# Patient Record
Sex: Female | Born: 1980 | Race: White | Hispanic: No | Marital: Married | Smoking: Never smoker
Health system: Southern US, Community
[De-identification: ages and names within clinical notes are randomized; demographics above are authoritative.]

## PROBLEM LIST (undated history)

## (undated) DIAGNOSIS — I10 Essential (primary) hypertension: Secondary | ICD-10-CM

## (undated) DIAGNOSIS — F419 Anxiety disorder, unspecified: Secondary | ICD-10-CM

## (undated) HISTORY — PX: NO PAST SURGERIES: SHX2092

---

## 2009-02-19 ENCOUNTER — Observation Stay: Payer: Self-pay | Admitting: Obstetrics and Gynecology

## 2009-03-01 ENCOUNTER — Encounter: Payer: Self-pay | Admitting: Maternal and Fetal Medicine

## 2009-03-08 ENCOUNTER — Observation Stay: Payer: Self-pay | Admitting: Obstetrics and Gynecology

## 2009-04-12 ENCOUNTER — Inpatient Hospital Stay: Payer: Self-pay | Admitting: Obstetrics and Gynecology

## 2016-05-26 NOTE — H&P (Signed)
Ms. Breanna Gallegos is a 35 y.o. female here LSH and bilateral salpingectomy  . Pt has a long history of menorrhagia . Pt with a long h/o of cycles for 7-10 days + clot s. More recently bleeding has been continuous . Pt was started on high dose OCPs and bleeding initially but has recently failed to control bleeding . Marland Kitchen. Recent u/s showed only 1.6 cm simple rt ovarian cyst the patient does not desire future fertility . Pap and EMBX negative   Past Medical History:  has a past medical history of Obesity.  Past Surgical History:  has no past surgical history on file. Family History: family history includes Diabetes mellitus in her father; Heart attack in her paternal grandfather; Hypertension in her father and paternal grandfather. Social History:  reports that she has never smoked. She has never used smokeless tobacco. She reports that she does not drink alcohol or use illicit drugs. OB/GYN History:  OB History    Gravida Para Term Preterm AB TAB SAB Ectopic Multiple Living   1 1 1       1       Allergies: has No Known Allergies. Medications:  Current Outpatient Prescriptions:  .  norethindrone-ethinyl estradiol biphasic (NECON 10/11) 0.5-35/1-35 mg-mcg/mg-mcg tablet, Take 1 tablet by mouth once daily. Day 1: 1  4x a day Day 2: 1 3 x a day, Day 3: 1 2x d, Day 4: 1 qd., Disp: 28 tablet, Rfl: 0 .  ondansetron (ZOFRAN-ODT) 4 MG disintegrating tablet, Take 1 tablet (4 mg total) by mouth every 8 (eight) hours as needed for Nausea for up to 7 days., Disp: 20 tablet, Rfl: 0  Review of Systems: General:                                          No fatigue or weight loss Eyes:                                                         No vision changes Ears:                                                          No hearing difficulty Respiratory:                No cough or shortness of breath Pulmonary:                                      No asthma or shortness of breath Cardiovascular:                      No chest pain, palpitations, dyspnea on exertion Gastrointestinal:                    No abdominal bloating, chronic diarrhea, constipations, masses, pain or hematochezia Genitourinary:  No hematuria, dysuria, abnormal vaginal discharge, pelvic pain, Menometrorrhagia Lymphatic:                                       No swollen lymph nodes Musculoskeletal:                   No muscle weakness Neurologic:                                      No extremity weakness, syncope, seizure disorder Psychiatric:                                      No history of depression, delusions or suicidal/homicidal ideation    Exam:      Vitals:   05/27/16  0851  BP: 142/88  Pulse:     Body mass index is 37.45 kg/(m^2).  WDWN white/  female in NAD   Lungs: CTA  CV : RRR without murmur   Breast: exam done in sitting and lying position : No dimpling or retraction, no dominant mass, no spontaneous discharge, no axillary adenopathy Neck:  no thyromegaly Abdomen: soft , no mass, normal active bowel sounds,  non-tender, no rebound tenderness Pelvic: tanner stage 5 ,  External genitalia: vulva /labia no lesions Urethra: no prolapse Vagina: normal physiologic d/c, limited room for TVH  Cervix: no lesions, no cervical motion tenderness   Uterus: normal size shape and contour, non-tender Adnexa: no mass,  non-tender   Rectovaginal:  Impression:   . Diagnoses of Abnormal uterine bleeding (AUB)/ menorrhagia uncontrolled by conservative management . Dysmenorrhea. Pt has elected for definitive surgery .  Risks of the procedure have been discussed .     Plan:  I have spoken with the patient regarding treatment options including expectant management, hormonal options, or surgical intervention. After a full discussion the pt elects to proceed with OCps . If bleeding doesnot improve I will consider Lysteda vs ablation vs hysterectomy         Orders Placed This  Encounter  Procedures  . Pap IG, HPV-hr - [facility]

## 2016-05-27 ENCOUNTER — Encounter
Admission: RE | Admit: 2016-05-27 | Discharge: 2016-05-27 | Disposition: A | Discharge status: Home / Self Care | Payer: BC Managed Care – PPO | Source: Ambulatory Visit | Attending: Obstetrics and Gynecology | Admitting: Obstetrics and Gynecology

## 2016-05-27 DIAGNOSIS — Z01810 Encounter for preprocedural cardiovascular examination: Secondary | ICD-10-CM | POA: Insufficient documentation

## 2016-05-27 DIAGNOSIS — Z01812 Encounter for preprocedural laboratory examination: Secondary | ICD-10-CM | POA: Diagnosis not present

## 2016-05-27 DIAGNOSIS — E669 Obesity, unspecified: Secondary | ICD-10-CM | POA: Diagnosis not present

## 2016-05-27 DIAGNOSIS — I10 Essential (primary) hypertension: Secondary | ICD-10-CM | POA: Diagnosis not present

## 2016-05-27 DIAGNOSIS — Z6837 Body mass index (BMI) 37.0-37.9, adult: Secondary | ICD-10-CM | POA: Diagnosis not present

## 2016-05-27 DIAGNOSIS — N939 Abnormal uterine and vaginal bleeding, unspecified: Secondary | ICD-10-CM | POA: Insufficient documentation

## 2016-05-27 HISTORY — DX: Anxiety disorder, unspecified: F41.9

## 2016-05-27 HISTORY — DX: Essential (primary) hypertension: I10

## 2016-05-27 LAB — CBC
HEMATOCRIT: 32.5 % — AB (ref 35.0–47.0)
Hemoglobin: 9.9 g/dL — ABNORMAL LOW (ref 12.0–16.0)
MCH: 19.9 pg — ABNORMAL LOW (ref 26.0–34.0)
MCHC: 30.5 g/dL — ABNORMAL LOW (ref 32.0–36.0)
MCV: 65.2 fL — AB (ref 80.0–100.0)
Platelets: 377 10*3/uL (ref 150–440)
RBC: 4.99 MIL/uL (ref 3.80–5.20)
RDW: 17.9 % — ABNORMAL HIGH (ref 11.5–14.5)
WBC: 7.1 10*3/uL (ref 3.6–11.0)

## 2016-05-27 LAB — TYPE AND SCREEN
ABO/RH(D): B POS
Antibody Screen: NEGATIVE

## 2016-05-27 LAB — BASIC METABOLIC PANEL
Anion gap: 7 (ref 5–15)
BUN: 10 mg/dL (ref 6–20)
CHLORIDE: 105 mmol/L (ref 101–111)
CO2: 25 mmol/L (ref 22–32)
CREATININE: 0.74 mg/dL (ref 0.44–1.00)
Calcium: 9.5 mg/dL (ref 8.9–10.3)
GFR calc non Af Amer: >60 mL/min (ref >60)
Glucose, Bld: 89 mg/dL (ref 65–99)
Potassium: 3.8 mmol/L (ref 3.5–5.1)
SODIUM: 137 mmol/L (ref 135–145)

## 2016-05-27 NOTE — Pre-Procedure Instructions (Signed)
Diastolic BP in upper 90's, instructed to contact PCP for treatment prior to surgery.  Patient instructed to take BP med moring of surgery if placed a BP medication.

## 2016-05-27 NOTE — Patient Instructions (Signed)
  Your procedure is scheduled on: 06/23/16 Mon Report to Same Day Surgery 2nd floor medical mall To find out your arrival time please call [phone] between 1PM - 3PM on 06/20/16 Fri  Remember: Instructions that are not followed completely may result in serious medical risk, up to and including death, or upon the discretion of your surgeon and anesthesiologist your surgery may need to be rescheduled.    _x___ 1. Do not eat food or drink liquids after midnight. No gum chewing or hard candies.     __x__ 2. No Alcohol for 24 hours before or after surgery.   __x__3. No Smoking for 24 prior to surgery.   ____  4. Bring all medications with you on the day of surgery if instructed.    __x__ 5. Notify your doctor if there is any change in your medical condition     (cold, fever, infections).     Do not wear jewelry, make-up, hairpins, clips or nail polish.  Do not wear lotions, powders, or perfumes. You may wear deodorant.  Do not shave 48 hours prior to surgery. Men may shave face and neck.  Do not bring valuables to the hospital.    [facility] not [facility] for any belongings or valuables.               Contacts, dentures or bridgework may not be worn into surgery.  Leave your suitcase in the car. After surgery it may be brought to your room.  For patients admitted to the hospital, discharge time is determined by your treatment team.   Patients discharged the day of surgery will not be allowed to drive home.    Please read over the following fact sheets that you were given:   [facility] Preparing for Surgery and or MRSA Information   _x___ Take these medicines the morning of surgery with A SIP OF WATER:    1. None  2.  3.  4.  5.  6.  ____ Fleet Enema (as directed)   _x___ Use CHG Soap or sage wipes as directed on instruction sheet   ____ Use inhalers on the day of surgery and bring to hospital day of surgery  ____ Stop metformin 2 days prior to surgery    ____  Take 1/2 of usual insulin dose the night before surgery and none on the morning of           surgery.   ____ Stop aspirin or coumadin, or plavix  _x__ Stop Anti-inflammatories such as Advil, Aleve, Ibuprofen, Motrin, Naproxen,          Naprosyn, Goodies powders or aspirin products. Ok to take Tylenol.   ____ Stop supplements until after surgery.    ____ Bring C-Pap to the hospital.

## 2016-05-28 NOTE — Pre-Procedure Instructions (Signed)
EKG sent to Anesthesia for review. 

## 2016-06-25 ENCOUNTER — Ambulatory Visit: Payer: BC Managed Care – PPO | Admitting: Anesthesiology

## 2016-06-25 ENCOUNTER — Observation Stay
Admission: RE | Admit: 2016-06-25 | Discharge: 2016-06-26 | Disposition: A | Discharge status: Home / Self Care | Payer: BC Managed Care – PPO | Source: Ambulatory Visit | Attending: Obstetrics and Gynecology | Admitting: Obstetrics and Gynecology

## 2016-06-25 ENCOUNTER — Encounter: Payer: Self-pay | Admitting: *Deleted

## 2016-06-25 ENCOUNTER — Encounter
Admission: RE | Disposition: A | Discharge status: Home / Self Care | Payer: Self-pay | Source: Ambulatory Visit | Attending: Obstetrics and Gynecology

## 2016-06-25 DIAGNOSIS — I10 Essential (primary) hypertension: Secondary | ICD-10-CM | POA: Insufficient documentation

## 2016-06-25 DIAGNOSIS — Z8249 Family history of ischemic heart disease and other diseases of the circulatory system: Secondary | ICD-10-CM | POA: Diagnosis not present

## 2016-06-25 DIAGNOSIS — N946 Dysmenorrhea, unspecified: Secondary | ICD-10-CM | POA: Insufficient documentation

## 2016-06-25 DIAGNOSIS — Z9889 Other specified postprocedural states: Secondary | ICD-10-CM

## 2016-06-25 DIAGNOSIS — N838 Other noninflammatory disorders of ovary, fallopian tube and broad ligament: Secondary | ICD-10-CM | POA: Insufficient documentation

## 2016-06-25 DIAGNOSIS — Z793 Long term (current) use of hormonal contraceptives: Secondary | ICD-10-CM | POA: Insufficient documentation

## 2016-06-25 DIAGNOSIS — N920 Excessive and frequent menstruation with regular cycle: Principal | ICD-10-CM | POA: Insufficient documentation

## 2016-06-25 DIAGNOSIS — E669 Obesity, unspecified: Secondary | ICD-10-CM | POA: Diagnosis not present

## 2016-06-25 DIAGNOSIS — Z833 Family history of diabetes mellitus: Secondary | ICD-10-CM | POA: Insufficient documentation

## 2016-06-25 DIAGNOSIS — Z6837 Body mass index (BMI) 37.0-37.9, adult: Secondary | ICD-10-CM | POA: Insufficient documentation

## 2016-06-25 HISTORY — PX: LAPAROSCOPIC SUPRACERVICAL HYSTERECTOMY: SHX5399

## 2016-06-25 HISTORY — PX: BILATERAL SALPINGECTOMY: SHX5743

## 2016-06-25 LAB — TYPE AND SCREEN
ABO/RH(D): B POS
Antibody Screen: NEGATIVE

## 2016-06-25 LAB — POCT PREGNANCY, URINE: PREG TEST UR: NEGATIVE

## 2016-06-25 SURGERY — HYSTERECTOMY, SUPRACERVICAL, LAPAROSCOPIC
Anesthesia: General

## 2016-06-25 MED ORDER — DEXAMETHASONE SODIUM PHOSPHATE 10 MG/ML IJ SOLN
INTRAMUSCULAR | Status: DC | PRN
Start: 2016-06-25 — End: 2016-06-25
  Administered 2016-06-25: 10 mg via INTRAVENOUS

## 2016-06-25 MED ORDER — ACETAMINOPHEN 10 MG/ML IV SOLN
INTRAVENOUS | Status: DC | PRN
  Administered 2016-06-25: 1000 mg via INTRAVENOUS

## 2016-06-25 MED ORDER — FENTANYL CITRATE (PF) 100 MCG/2ML IJ SOLN
INTRAMUSCULAR | Status: DC | PRN
  Administered 2016-06-25: 100 ug via INTRAVENOUS
  Administered 2016-06-25 (×3): 50 ug via INTRAVENOUS

## 2016-06-25 MED ORDER — KETOROLAC TROMETHAMINE 30 MG/ML IJ SOLN
INTRAMUSCULAR | Status: DC | PRN
Start: 2016-06-25 — End: 2016-06-25
  Administered 2016-06-25: 30 mg via INTRAVENOUS

## 2016-06-25 MED ORDER — MIDAZOLAM HCL 2 MG/2ML IJ SOLN
INTRAMUSCULAR | Status: DC | PRN
  Administered 2016-06-25: 2 mg via INTRAVENOUS

## 2016-06-25 MED ORDER — MORPHINE SULFATE (PF) 2 MG/ML IV SOLN: 1.0000 mg | INTRAVENOUS | Status: DC | PRN

## 2016-06-25 MED ORDER — BUPIVACAINE HCL 0.5 % IJ SOLN
INTRAMUSCULAR | Status: DC | PRN
Start: 2016-06-25 — End: 2016-06-25
  Administered 2016-06-25: 3 mL

## 2016-06-25 MED ORDER — PROMETHAZINE HCL 25 MG/ML IJ SOLN: 6.2500 mg | INTRAMUSCULAR | Status: DC | PRN

## 2016-06-25 MED ORDER — LACTATED RINGERS IV SOLN
INTRAVENOUS | Status: DC
  Administered 2016-06-25 – 2016-06-26 (×2): via INTRAVENOUS

## 2016-06-25 MED ORDER — ONDANSETRON HCL 4 MG/2ML IJ SOLN
INTRAMUSCULAR | Status: DC | PRN
  Administered 2016-06-25 (×2): 4 mg via INTRAVENOUS

## 2016-06-25 MED ORDER — ROCURONIUM BROMIDE 100 MG/10ML IV SOLN
INTRAVENOUS | Status: DC | PRN
  Administered 2016-06-25: 10 mg via INTRAVENOUS
  Administered 2016-06-25: 20 mg via INTRAVENOUS
  Administered 2016-06-25: 40 mg via INTRAVENOUS
  Administered 2016-06-25: 10 mg via INTRAVENOUS

## 2016-06-25 MED ORDER — SUCCINYLCHOLINE CHLORIDE 20 MG/ML IJ SOLN
INTRAMUSCULAR | Status: DC | PRN
  Administered 2016-06-25: 100 mg via INTRAVENOUS

## 2016-06-25 MED ORDER — ONDANSETRON HCL 4 MG PO TABS: 4.0000 mg | ORAL_TABLET | Freq: Four times a day (QID) | ORAL | Status: DC | PRN

## 2016-06-25 MED ORDER — CEFOXITIN SODIUM-DEXTROSE 2-2.2 GM-% IV SOLR (PREMIX)
2.0000 g | INTRAVENOUS | Status: AC
  Administered 2016-06-25: 2000 mg via INTRAVENOUS

## 2016-06-25 MED ORDER — ACETAMINOPHEN 10 MG/ML IV SOLN
INTRAVENOUS | Status: AC
  Filled 2016-06-25: qty 100

## 2016-06-25 MED ORDER — OXYCODONE HCL 5 MG/5ML PO SOLN
5.0000 mg | Freq: Once | ORAL | Status: DC | PRN
Start: 2016-06-25 — End: 2016-06-25

## 2016-06-25 MED ORDER — IBUPROFEN 800 MG PO TABS
800.0000 mg | ORAL_TABLET | Freq: Three times a day (TID) | ORAL | Status: DC | PRN
  Administered 2016-06-25 – 2016-06-26 (×2): 800 mg via ORAL
  Filled 2016-06-25 (×2): qty 1

## 2016-06-25 MED ORDER — LIDOCAINE HCL (CARDIAC) 20 MG/ML IV SOLN
INTRAVENOUS | Status: DC | PRN
  Administered 2016-06-25: 100 mg via INTRAVENOUS

## 2016-06-25 MED ORDER — FAMOTIDINE 20 MG PO TABS
20.0000 mg | ORAL_TABLET | Freq: Once | ORAL | Status: AC
  Administered 2016-06-25: 20 mg via ORAL

## 2016-06-25 MED ORDER — MEPERIDINE HCL 25 MG/ML IJ SOLN: 6.2500 mg | INTRAMUSCULAR | Status: DC | PRN

## 2016-06-25 MED ORDER — PROPOFOL 10 MG/ML IV BOLUS
INTRAVENOUS | Status: DC | PRN
  Administered 2016-06-25: 180 mg via INTRAVENOUS

## 2016-06-25 MED ORDER — ONDANSETRON HCL 4 MG/2ML IJ SOLN: 4.0000 mg | Freq: Four times a day (QID) | INTRAMUSCULAR | Status: DC | PRN

## 2016-06-25 MED ORDER — FENTANYL CITRATE (PF) 100 MCG/2ML IJ SOLN: 25.0000 ug | INTRAMUSCULAR | Status: DC | PRN

## 2016-06-25 MED ORDER — OXYCODONE HCL 5 MG PO TABS: 5.0000 mg | ORAL_TABLET | Freq: Once | ORAL | Status: DC | PRN

## 2016-06-25 MED ORDER — OXYCODONE-ACETAMINOPHEN 5-325 MG PO TABS
1.0000 | ORAL_TABLET | ORAL | Status: DC | PRN
  Administered 2016-06-25 (×2): 1 via ORAL
  Filled 2016-06-25 (×2): qty 1

## 2016-06-25 MED ORDER — LACTATED RINGERS IV SOLN
INTRAVENOUS | Status: DC
  Administered 2016-06-25 (×3): via INTRAVENOUS

## 2016-06-25 SURGICAL SUPPLY — 39 items
BAG URO DRAIN 2000ML W/SPOUT (MISCELLANEOUS) ×4 IMPLANT
BLADE SURG SZ11 CARB STEEL (BLADE) ×4 IMPLANT
CANISTER SUCT 1200ML W/VALVE (MISCELLANEOUS) ×4 IMPLANT
CATH FOLEY 2WAY  5CC 16FR (CATHETERS) ×2
CATH URTH 16FR FL 2W BLN LF (CATHETERS) ×2 IMPLANT
CHLORAPREP W/TINT 26ML (MISCELLANEOUS) ×4 IMPLANT
CLOSURE WOUND 1/2 X4 (GAUZE/BANDAGES/DRESSINGS) ×1
DRSG TEGADERM 2-3/8X2-3/4 SM (GAUZE/BANDAGES/DRESSINGS) ×4 IMPLANT
GAUZE SPONGE NON-WVN 2X2 STRL (MISCELLANEOUS) ×2 IMPLANT
GLOVE BIO SURGEON STRL SZ8 (GLOVE) ×12 IMPLANT
GOWN STRL REUS W/ TWL LRG LVL3 (GOWN DISPOSABLE) ×4 IMPLANT
GOWN STRL REUS W/ TWL XL LVL3 (GOWN DISPOSABLE) ×2 IMPLANT
GOWN STRL REUS W/TWL LRG LVL3 (GOWN DISPOSABLE) ×4
GOWN STRL REUS W/TWL XL LVL3 (GOWN DISPOSABLE) ×2
GRASPER SUT TROCAR 14GX15 (MISCELLANEOUS) ×4 IMPLANT
IRRIGATION STRYKERFLOW (MISCELLANEOUS) ×2 IMPLANT
IRRIGATOR STRYKERFLOW (MISCELLANEOUS) ×4
IV LACTATED RINGERS 1000ML (IV SOLUTION) ×4 IMPLANT
KIT RM TURNOVER CYSTO AR (KITS) ×4 IMPLANT
LABEL OR SOLS (LABEL) ×4 IMPLANT
MORCELLATOR XCISE  COR (MISCELLANEOUS) ×2
MORCELLATOR XCISE COR (MISCELLANEOUS) ×2 IMPLANT
NS IRRIG 500ML POUR BTL (IV SOLUTION) ×4 IMPLANT
PACK GYN LAPAROSCOPIC (MISCELLANEOUS) ×4 IMPLANT
PAD OB MATERNITY 4.3X12.25 (PERSONAL CARE ITEMS) ×4 IMPLANT
PAD PREP 24X41 OB/GYN DISP (PERSONAL CARE ITEMS) ×4 IMPLANT
SET CYSTO W/LG BORE CLAMP LF (SET/KITS/TRAYS/PACK) ×4 IMPLANT
SHEARS HARMONIC ACE PLUS 36CM (ENDOMECHANICALS) ×4 IMPLANT
SOLUTION ELECTROLUBE (MISCELLANEOUS) ×4 IMPLANT
SPONGE VERSALON 2X2 STRL (MISCELLANEOUS) ×2
STRIP CLOSURE SKIN 1/2X4 (GAUZE/BANDAGES/DRESSINGS) ×3 IMPLANT
SUT VIC AB 2-0 UR6 27 (SUTURE) ×4 IMPLANT
SUT VIC AB 4-0 SH 27 (SUTURE) ×4
SUT VIC AB 4-0 SH 27XANBCTRL (SUTURE) ×4 IMPLANT
SUT VICRYL 0 AB UR-6 (SUTURE) ×4 IMPLANT
SYRINGE 10CC LL (SYRINGE) ×4 IMPLANT
TROCAR ENDO BLADELESS 11MM (ENDOMECHANICALS) ×4 IMPLANT
TROCAR XCEL UNIV SLVE 11M 100M (ENDOMECHANICALS) ×4 IMPLANT
TUBING INSUFFLATOR HI FLOW (MISCELLANEOUS) ×4 IMPLANT

## 2016-06-25 NOTE — Anesthesia Postprocedure Evaluation (Signed)
Anesthesia Post Note  Patient: [name]  Procedure(s) Performed: Procedure(s) (LRB): LAPAROSCOPIC SUPRACERVICAL HYSTERECTOMY (N/A) BILATERAL SALPINGECTOMY (Bilateral)  Patient location during evaluation: PACU Anesthesia Type: General Level of consciousness: awake and alert and oriented Pain management: pain level controlled Vital Signs Assessment: post-procedure vital signs reviewed and stable Respiratory status: spontaneous breathing, nonlabored ventilation and respiratory function stable Cardiovascular status: blood pressure returned to baseline and stable Postop Assessment: no signs of nausea or vomiting Anesthetic complications: no    Last Vitals:  Filed Vitals:   06/25/16 0902 06/25/16 1224  BP: 147/94 135/75  Pulse:  80  Temp:  37.1 C  Resp:  19    Last Pain:  Filed Vitals:   06/25/16 1233  PainSc: Asleep                 Micha Erck

## 2016-06-25 NOTE — Anesthesia Preprocedure Evaluation (Addendum)
Anesthesia Evaluation  Patient identified by MRN, date of birth, ID band Patient awake    Reviewed: Allergy & Precautions, NPO status , Patient's Chart, lab work & pertinent test results  History of Anesthesia Complications Negative for: history of anesthetic complications  Airway Mallampati: II  TM Distance: >3 FB Neck ROM: Full    Dental no notable dental hx.    Pulmonary neg COPD,    breath sounds clear to auscultation- rhonchi (-) wheezing      Cardiovascular Exercise Tolerance: Good hypertension (had gHTN, and has had elevated pressures when in clinic, but takes BP at home and is normal), (-) CAD and (-) Past MI  Rhythm:Regular Rate:Normal - Systolic murmurs and - Diastolic murmurs    Neuro/Psych PSYCHIATRIC DISORDERS Anxiety    GI/Hepatic negative GI ROS, Neg liver ROS,   Endo/Other  negative endocrine ROSneg diabetes  Renal/GU negative Renal ROS     Musculoskeletal negative musculoskeletal ROS (+)   Abdominal (+) + obese,   Peds  Hematology negative hematology ROS (+)   Anesthesia Other Findings   Reproductive/Obstetrics negative OB ROS                           Anesthesia Physical Anesthesia Plan  ASA: II  Anesthesia Plan: General   Post-op Pain Management:    Induction: Intravenous  Airway Management Planned: Oral ETT  Additional Equipment:   Intra-op Plan:   Post-operative Plan: Extubation in OR  Informed Consent: I have reviewed the patients History and Physical, chart, labs and discussed the procedure including the risks, benefits and alternatives for the proposed anesthesia with the patient or authorized representative who has indicated his/her understanding and acceptance.   Dental advisory given  Plan Discussed with: CRNA and Anesthesiologist  Anesthesia Plan Comments:         Anesthesia Quick Evaluation

## 2016-06-25 NOTE — Brief Op Note (Signed)
06/25/2016  12:06 PM  PATIENT:  Breanna Gallegos  35 y.o. female  PRE-OPERATIVE DIAGNOSIS:  Menorrhagia  POST-OPERATIVE DIAGNOSIS:  Menorrhagia, right paratubal cyst   PROCEDURE:  Procedure(s): LAPAROSCOPIC SUPRACERVICAL HYSTERECTOMY (N/A) BILATERAL SALPINGECTOMY (Bilateral)  SURGEON:  Surgeon(s) and Role:    Suzy Bouchard* Arzell Mcgeehan J Chyrel Taha, MD - Primary    Christeen DouglasBethany Beasley MD  PHYSICIAN ASSISTANT:   ASSISTANTS: none   ANESTHESIA:   general  EBL:  Total I/O In: 1000 [I.V.:1000] Out: 650 [Urine:600; Blood:50]  BLOOD ADMINISTERED:none  DRAINS: Urinary Catheter (Foley)   LOCAL MEDICATIONS USED:  MARCAINE     SPECIMEN:  Source of Specimen:  uterus , bilateral tubes   DISPOSITION OF SPECIMEN:  PATHOLOGY  COUNTS:  YES  TOURNIQUET:  * No tourniquets in log *  DICTATION: .verbal  PLAN OF CARE: Admit for overnight observation  PATIENT DISPOSITION:  PACU - hemodynamically stable.   Delay start of Pharmacological VTE agent (>24hrs) due to surgical blood loss or risk of bleeding: not applicable

## 2016-06-25 NOTE — Op Note (Signed)
Breanna Gallegos, Breanna Gallegos               ACCOUNT NO.:  [id number]  MEDICAL RECORD NO.:  [id number]  LOCATION:  [facility]                         FACILITY:  [facility]  PHYSICIAN:  Jennell Corner, MDDATE OF BIRTH:  February 13, 1981  DATE OF PROCEDURE:  06/25/2016 DATE OF DISCHARGE:                              OPERATIVE REPORT   PREOPERATIVE DIAGNOSIS:  Menorrhagia, failing conservative treatment.  POSTOPERATIVE DIAGNOSIS: 1. Menorrhagia, failing conservative treatment. 2. Right paratubal cyst.  PROCEDURE: 1. Laparoscopic supracervical hysterectomy. 2. Bilateral salpingectomy.  ANESTHESIA:  General endotracheal anesthesia.  SURGEON:  Jennell Corner, MD  FIRST ASSISTANT:  Dr. Dalbert Garnet.  INDICATIONS:  A 35 year old female, failing conservative treatment for the prolonged menorrhagia.  The patient's Pap smear and endometrial biopsy both of which were normal.  Ultrasound failed to demonstrate any significant fibroids.  The patient had been counseled regarding the potential risks for morcellation and the potential risks for spreading leiomyosarcoma through this procedure and consents have been signed.  DESCRIPTION OF PROCEDURE:  After adequate general endotracheal anesthesia, patient was placed in dorsal supine position.  Legs in the [place] stirrups.  Abdomen, perineum, and vagina were prepped, and the patient was draped in normal sterile fashion.  Time-out was performed. The patient did receive 2 g IV cefoxitin prior to commencement of the case.  Foley catheter was placed in the bladder yielding clear urine. Speculum was placed in the vagina and the anterior cervix was grasped with a single-tooth tenaculum, and the uterine sound was placed into the endometrium and the 2 were taped together with Steri-Strips.  This was to be used for uterine manipulation during the procedure.  Gloves were changed.  Attention directed to the patient's abdomen.  A 15 mm infraumbilical incision was made after  injecting with 0.5% Marcaine. The laparoscope was advanced into the abdominal cavity under direct visualization with the Optiview cannula.  The patient was placed in Trendelenburg.  Second port site was placed in left lower quadrant.  A 10 mm port was placed 3 cm medial to the left anterior iliac spine. Direct visualization was used to place the port. Third port site was placed in the right lower quadrant.  Again, a 5 mm port was advanced under direct visualization, 3 mm medial to the right anterior iliac spine.  Right fallopian tube was grasped with the fimbriated end and with the Harmonic Scalpel, the fallopian tube was dissected from the mesosalpinx.  The uterine ovarian ligament was then transected with Harmonic Scalpel, and the broad ligament was then incised and bladder flap was created anteriorly.  The left uterine artery was identified and skeletonized and first cauterized with the Kleppinger cautery and then transected with Harmonic Scalpel.  Similar procedure was repeated on the patient's right side.  At this time, a paratubal cyst approximately 2 x 1 cm was identified.  The fallopian tube was grasped with the distal portion and dissected free from the mesosalpinx.  Uterine ovarian ligament was transected and broad ligament was incised, and the bladder flap anteriorly was completed.  The right uterine artery was identified, skeletonized, and cauterized with the Kleppinger and then transected with the Harmonic Scalpel.  The cervix was then transected proximally at the level of  uterosacral ligaments.  Uterine sound was removed and the uterus was detached good.  Hemostasis was noted.  A vaginal hand allowed for cautery of the remaining endocervical stump.  Gloves were changed again and the morcellator was brought up to the operative field and was advanced into the abdominal cavity under direct visualization.  The uterus and fallopian tubes were then morcellated through this port  site. The patient's abdomen was irrigated and good hemostasis was noted. Normal peristaltic ureteral activity was seen prior to initial dissection and again visualized at the end of the case.  The upper abdomen appeared normal.  The patient's abdomen was deflated and the infraumbilical and left lower port site were closed with a 2 layer closure, and fascial layer closed with 2-0 Vicryl suture and all skin incisions were closed with interrupted 4-0 Vicryl suture.  Single-tooth tenaculum was removed from the anterior cervix.  Good hemostasis was noted.  There were no complications.  ESTIMATED BLOOD LOSS:  50 mL.  INTRAOPERATIVE FLUIDS:  1200 mL.  URINE OUTPUT:  600 mL.  Patient tolerated the procedure well and was taken to recovery room in good condition.  Of note, Foley was placed prior to commencement of the case.          ______________________________ Jennell Cornerhomas Schermerhorn, MD     TS/MEDQ  D:  06/25/2016  T:  06/25/2016  Job:  [id number]

## 2016-06-25 NOTE — Transfer of Care (Signed)
Immediate Anesthesia Transfer of Care Note  Patient: Breanna Gallegos  Procedure(s) Performed: Procedure(s): LAPAROSCOPIC SUPRACERVICAL HYSTERECTOMY (N/A) BILATERAL SALPINGECTOMY (Bilateral)  Patient Location: PACU  Anesthesia Type:General  Level of Consciousness: sedated  Airway & Oxygen Therapy: Patient Spontanous Breathing and Patient connected to face mask oxygen  Post-op Assessment: Report given to RN and Post -op Vital signs reviewed and stable  Post vital signs: Reviewed and stable  Last Vitals:  Filed Vitals:   06/25/16 0839 06/25/16 0902  BP:  147/94  Pulse: 107   Temp: 36.8 C   Resp: 20     Last Pain: There were no vitals filed for this visit.       Complications: No apparent anesthesia complications

## 2016-06-25 NOTE — Progress Notes (Signed)
Patient ID: Breanna Gallegos, female   DOB: 04-Jun-1981, 35 y.o.   MRN: [id number] Dos . Doing well . No significantpain . + hungry  O: VSS  Abd ; incisions C/D/I  No blood on pad  A: Stable  P: cont care  D/c foley

## 2016-06-25 NOTE — Progress Notes (Signed)
Labs reviewed , negative HCG , . All questions answered . Proceed with LSH + bilat salpingectomy

## 2016-06-25 NOTE — Anesthesia Procedure Notes (Signed)
Procedure Name: Intubation Date/Time: 06/25/2016 10:52 AM Performed by: Junious SilkNOLES, Page Pucciarelli Pre-anesthesia Checklist: Patient identified, Patient being monitored, Timeout performed, Emergency Drugs available and Suction available Patient Re-evaluated:Patient Re-evaluated prior to inductionOxygen Delivery Method: Circle system utilized Preoxygenation: Pre-oxygenation with 100% oxygen Intubation Type: IV induction Ventilation: Mask ventilation without difficulty Laryngoscope Size: Mac and 3 Grade View: Grade I Tube type: Oral Tube size: 7.0 mm Number of attempts: 1 Airway Equipment and Method: Stylet Placement Confirmation: ETT inserted through vocal cords under direct vision,  positive ETCO2 and breath sounds checked- equal and bilateral Secured at: 21 cm Tube secured with: Tape Dental Injury: Teeth and Oropharynx as per pre-operative assessment

## 2016-06-26 DIAGNOSIS — N838 Other noninflammatory disorders of ovary, fallopian tube and broad ligament: Secondary | ICD-10-CM | POA: Diagnosis not present

## 2016-06-26 LAB — CBC
HEMATOCRIT: 31.6 % — AB (ref 35.0–47.0)
HEMOGLOBIN: 10 g/dL — AB (ref 12.0–16.0)
MCH: 19.2 pg — ABNORMAL LOW (ref 26.0–34.0)
MCHC: 31.6 g/dL — AB (ref 32.0–36.0)
MCV: 60.8 fL — ABNORMAL LOW (ref 80.0–100.0)
Platelets: 338 10*3/uL (ref 150–440)
RBC: 5.2 MIL/uL (ref 3.80–5.20)
RDW: 18.5 % — AB (ref 11.5–14.5)
WBC: 15.3 10*3/uL — AB (ref 3.6–11.0)

## 2016-06-26 LAB — BASIC METABOLIC PANEL
ANION GAP: 6 (ref 5–15)
BUN: 11 mg/dL (ref 6–20)
CALCIUM: 8.9 mg/dL (ref 8.9–10.3)
CO2: 25 mmol/L (ref 22–32)
CREATININE: 0.75 mg/dL (ref 0.44–1.00)
Chloride: 107 mmol/L (ref 101–111)
GFR calc non Af Amer: >60 mL/min (ref >60)
Glucose, Bld: 135 mg/dL — ABNORMAL HIGH (ref 65–99)
Potassium: 4.8 mmol/L (ref 3.5–5.1)
SODIUM: 138 mmol/L (ref 135–145)

## 2016-06-26 LAB — SURGICAL PATHOLOGY

## 2016-06-26 MED ORDER — DOCUSATE SODIUM 100 MG PO CAPS: 100.0000 mg | ORAL_CAPSULE | Freq: Two times a day (BID) | ORAL | Status: AC

## 2016-06-26 MED ORDER — IBUPROFEN 800 MG PO TABS: 800.0000 mg | ORAL_TABLET | Freq: Three times a day (TID) | ORAL | Status: AC | PRN

## 2016-06-26 MED ORDER — OXYCODONE-ACETAMINOPHEN 5-325 MG PO TABS: 1.0000 | ORAL_TABLET | ORAL | Status: DC | PRN

## 2016-06-26 NOTE — Progress Notes (Signed)
Discharge instructions complete and prescriptions given. Patient verbalizes understanding of teaching. Patient discharged home at 0940.

## 2016-06-26 NOTE — Discharge Summary (Signed)
Physician Discharge Summary  Patient ID: Breanna Gallegos MRN: [id number] DOB/AGE: [birth year] 35 y.o.  Admit date: 06/25/2016 Discharge date: 06/26/2016  Admission Diagnoses:menorrrhagia  Discharge Diagnoses: same  Active Problems:   Post-operative state   Discharged Condition: good  Hospital Course: underwent an uncomplicated LSH , bilat salpingectomy  . Stable on d/c . discharge hct 31% , bun/cr 11/0.7  Consults: None  Significant Diagnostic Studies: labs: as above  Treatments: surgery: as above  Discharge Exam: Blood pressure 129/74, pulse 63, temperature 98.3 F (36.8 C), temperature source Oral, resp. rate 16, SpO2 100 %. General appearance: alert and cooperative Resp: clear to auscultation bilaterally Cardio: regular rate and rhythm, S1, S2 normal, no murmur, click, rub or gallop GI: soft, non-tender; bowel sounds normal; no masses,  no organomegaly  Disposition:      Medication List    STOP taking these medications        levonorgestrel-ethinyl estradiol 0.15-0.03 MG tablet  Commonly known as:  SEASONALE,INTROVALE,JOLESSA      TAKE these medications        docusate sodium 100 MG capsule  Commonly known as:  COLACE  Take 1 capsule (100 mg total) by mouth 2 (two) times daily.     ibuprofen 800 MG tablet  Commonly known as:  ADVIL,MOTRIN  Take 1 tablet (800 mg total) by mouth every 8 (eight) hours as needed (mild pain).     oxyCODONE-acetaminophen 5-325 MG tablet  Commonly known as:  PERCOCET/ROXICET  Take 1-2 tablets by mouth every 4 (four) hours as needed (moderate to severe pain (when tolerating fluids)).           Follow-up Information    Follow up with Jerrin Recore, MD In 20 weeks.   Specialty:  Obstetrics and Gynecology   Why:  For wound re-check   Contact information:   [address] [facility] [place] [id number] [phone]       Signed: Jennell CornerSCHERMERHORN,Onyx Edgley 06/26/2016, 8:02 AM

## 2017-07-22 ENCOUNTER — Other Ambulatory Visit: Payer: Self-pay | Admitting: Obstetrics and Gynecology

## 2017-07-22 DIAGNOSIS — Z1231 Encounter for screening mammogram for malignant neoplasm of breast: Secondary | ICD-10-CM

## 2017-08-06 ENCOUNTER — Ambulatory Visit
Admission: RE | Admit: 2017-08-06 | Discharge: 2017-08-06 | Disposition: A | Discharge status: Home / Self Care | Payer: BC Managed Care – PPO | Source: Ambulatory Visit | Attending: Obstetrics and Gynecology | Admitting: Obstetrics and Gynecology

## 2017-08-06 DIAGNOSIS — Z1231 Encounter for screening mammogram for malignant neoplasm of breast: Secondary | ICD-10-CM | POA: Diagnosis present

## 2020-02-04 ENCOUNTER — Ambulatory Visit: Payer: BC Managed Care – PPO | Attending: Internal Medicine

## 2020-02-04 DIAGNOSIS — Z23 Encounter for immunization: Secondary | ICD-10-CM | POA: Insufficient documentation

## 2020-02-04 NOTE — Progress Notes (Signed)
   Covid-19 Vaccination Clinic  Name:  Breanna Gallegos    MRN: [id number] DOB: 05/11/1981  02/04/2020  [name] was observed post Covid-19 immunization for 15 minutes without incidence. She was provided with Vaccine Information Sheet and instruction to access the V-Safe system.   [name] was instructed to call 911 with any severe reactions post vaccine: Marland Kitchen Difficulty breathing  . Swelling of your face and throat  . A fast heartbeat  . A bad rash all over your body  . Dizziness and weakness    Immunizations Administered    Name Date Dose VIS Date Route   Moderna COVID-19 Vaccine 02/04/2020  4:19 PM 0.5 mL 11/08/2019 Intramuscular   Manufacturer: Moderna   Lot: [id number]   NDC: 13086-578-46

## 2020-03-03 ENCOUNTER — Ambulatory Visit: Payer: BC Managed Care – PPO | Attending: Internal Medicine

## 2020-03-03 DIAGNOSIS — Z23 Encounter for immunization: Secondary | ICD-10-CM

## 2020-03-03 NOTE — Progress Notes (Signed)
   Covid-19 Vaccination Clinic  Name:  [name]    MRN: [id number] DOB: 29-Jul-1981  03/03/2020  [name] was observed post Covid-19 immunization for 15 minutes without incident. She was provided with Vaccine Information Sheet and instruction to access the V-Safe system.   [name] was instructed to call 911 with any severe reactions post vaccine: Marland Kitchen Difficulty breathing  . Swelling of face and throat  . A fast heartbeat  . A bad rash all over body  . Dizziness and weakness   Immunizations Administered    Name Date Dose VIS Date Route   Moderna COVID-19 Vaccine 03/03/2020  1:27 PM 0.5 mL 11/08/2019 Intramuscular   Manufacturer: Gala Murdoch   Lot: [id number]   NDC: [id number]

## 2020-03-08 ENCOUNTER — Other Ambulatory Visit: Payer: Self-pay | Admitting: Certified Nurse Midwife

## 2020-03-08 DIAGNOSIS — N6321 Unspecified lump in the left breast, upper outer quadrant: Secondary | ICD-10-CM

## 2020-03-20 ENCOUNTER — Ambulatory Visit
Admission: RE | Admit: 2020-03-20 | Discharge: 2020-03-20 | Disposition: A | Discharge status: Home / Self Care | Payer: BC Managed Care – PPO | Source: Ambulatory Visit | Attending: Certified Nurse Midwife | Admitting: Certified Nurse Midwife

## 2020-03-20 DIAGNOSIS — N6321 Unspecified lump in the left breast, upper outer quadrant: Secondary | ICD-10-CM

## 2020-05-13 IMAGING — MG DIGITAL DIAGNOSTIC BILAT W/ TOMO W/ CAD
6 of 10 series · 6 of 30 positions shown · non-contrast
Comparison: Previous exam(s).

CLINICAL DATA: Palpable lump in the left breast.

EXAM:
DIGITAL DIAGNOSTIC BILATERAL MAMMOGRAM WITH CAD AND TOMO
ULTRASOUND LEFT BREAST

[R CC synth-2D]
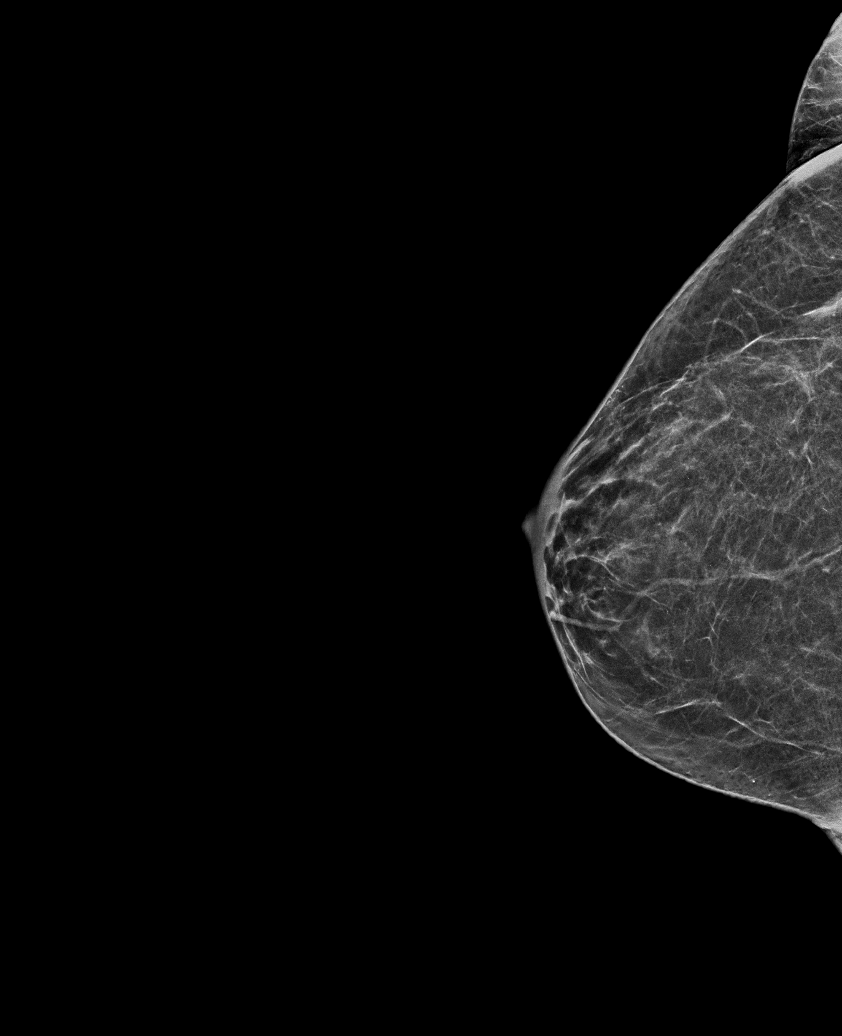

[L CC synth-2D]
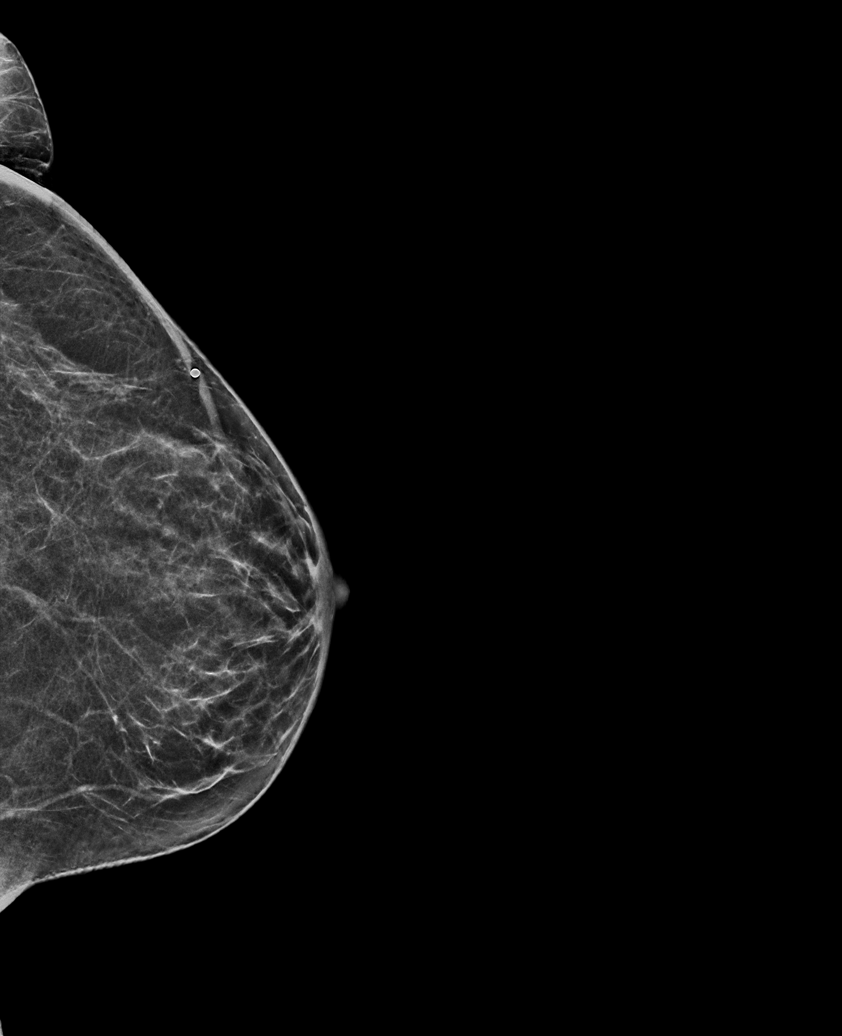

[L TAN synth-2D]
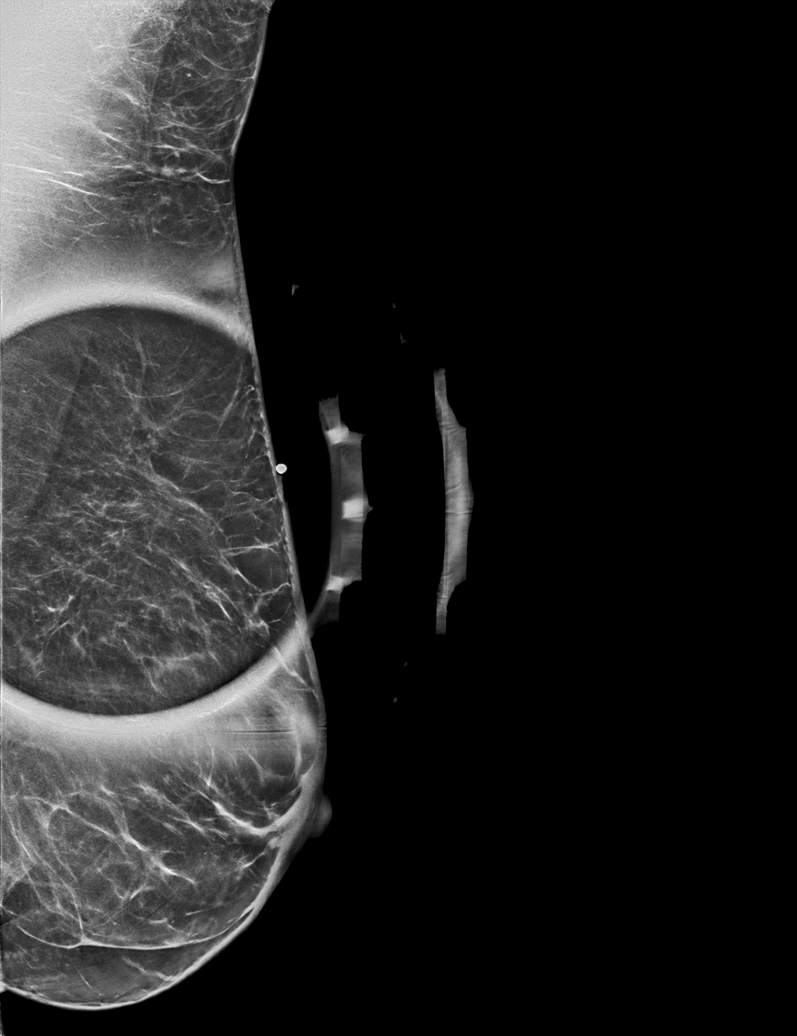

[L MLO synth-2D]
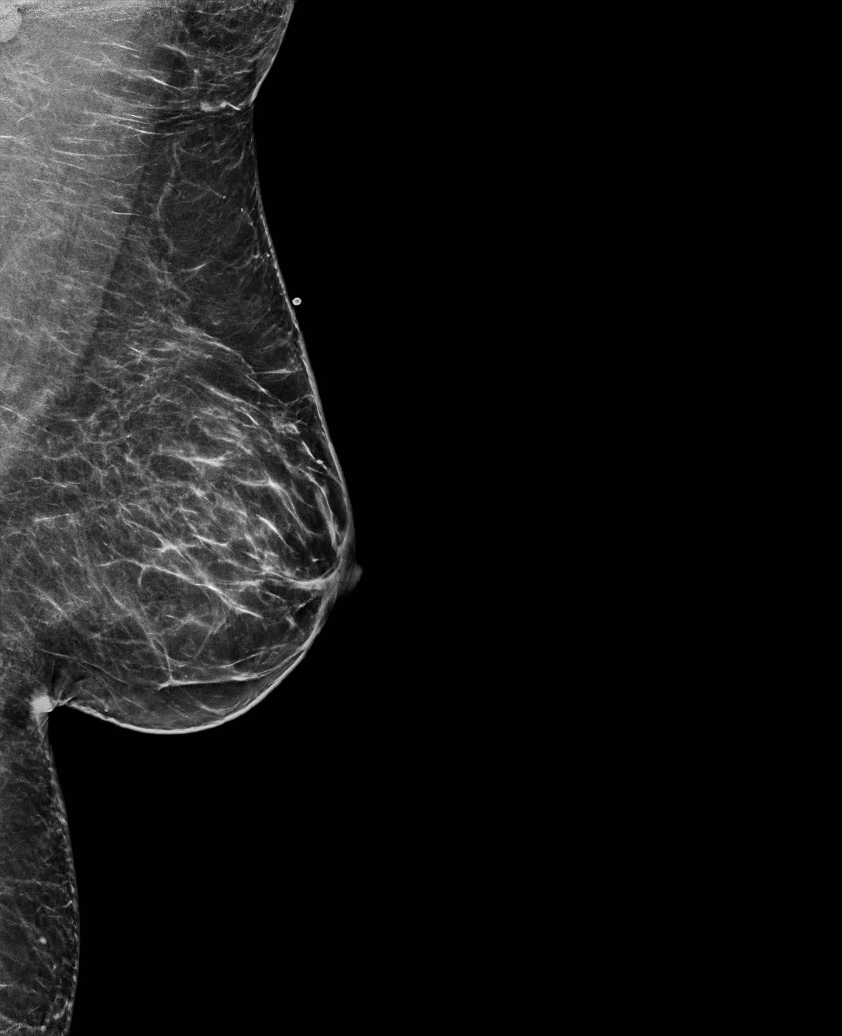

[R MLO synth-2D]
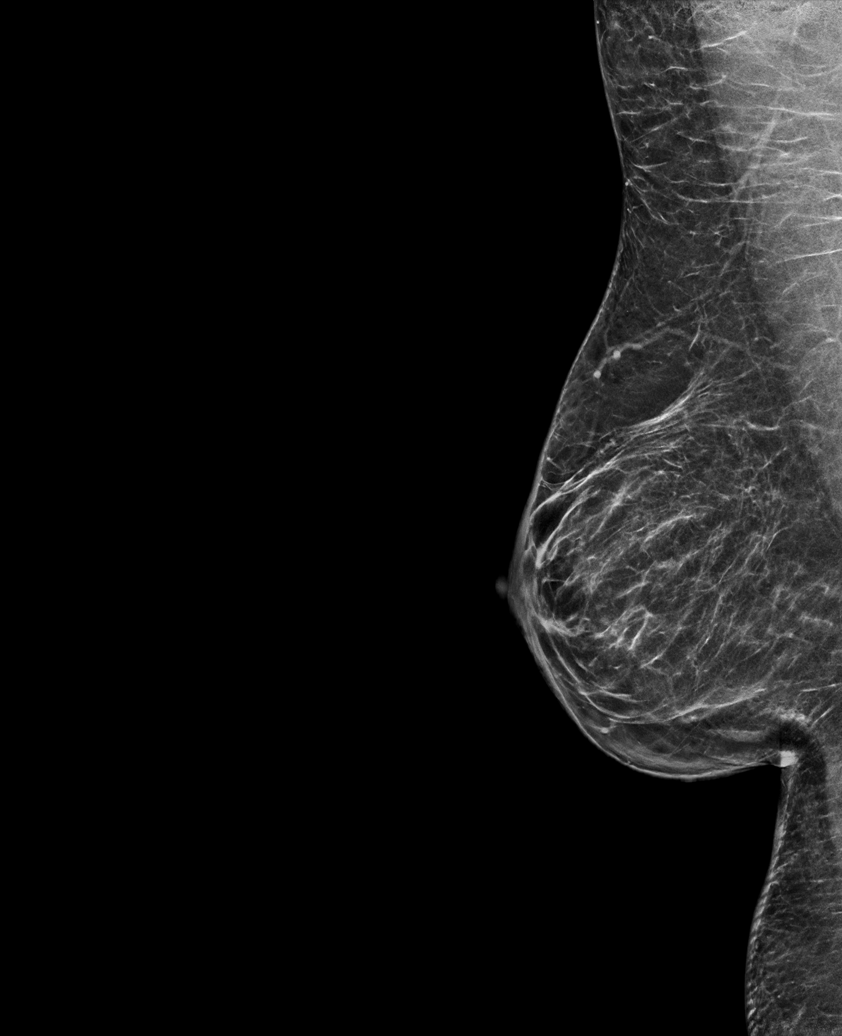

[L CC tomo · tomo slice 27/53.0]
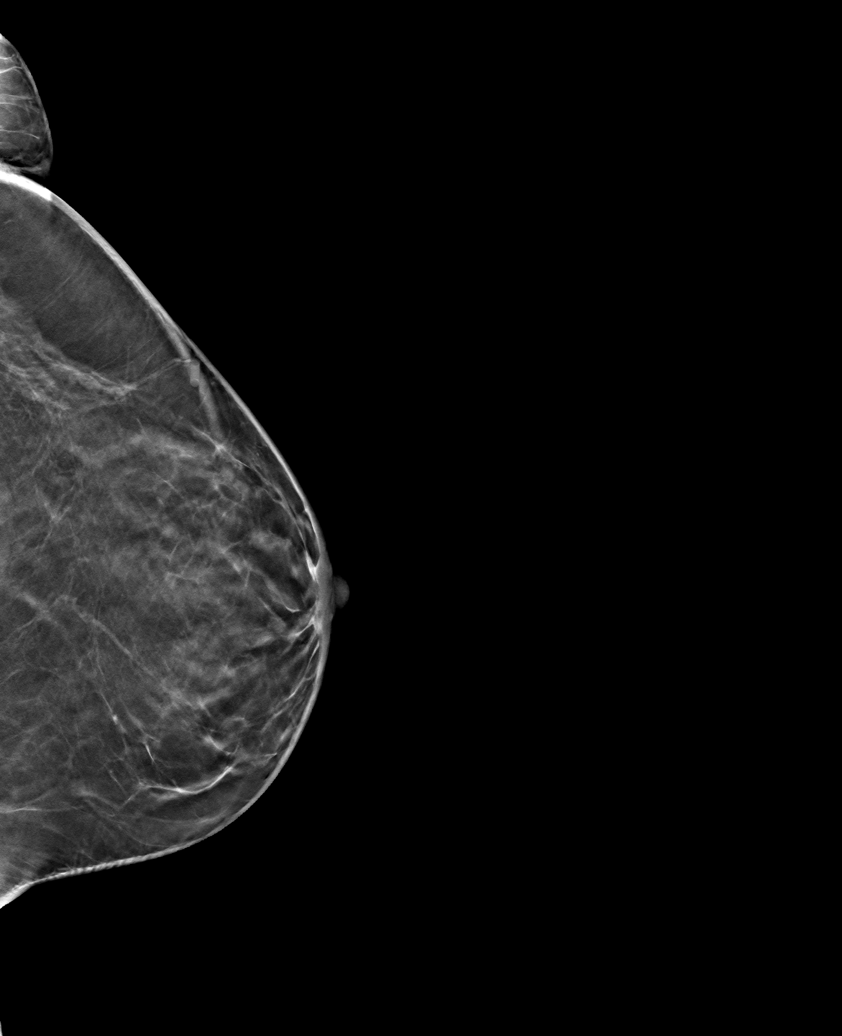

[6 of 30 positions shown; findings below may reference images not displayed]

ACR Breast Density Category b: There are scattered areas of
fibroglandular density.
FINDINGS: No suspicious masses, calcifications, or distortion are identified
in either breast.

Mammographic images were processed with CAD.

On physical exam, no suspicious lumps are identified.

Targeted ultrasound is performed, showing a dense island of
glandular tissue in the region of the patient's symptoms. No
suspicious masses.
IMPRESSION: No mammographic or sonographic evidence of malignancy.

RECOMMENDATION:
Annual screening mammography. Treatment of the patient's symptoms
should be based on clinical and physical exam given lack of imaging
findings.

I have discussed the findings and recommendations with the patient.
If applicable, a reminder letter will be sent to the patient
regarding the next appointment.

BI-RADS CATEGORY  1: Negative.

## 2020-11-27 ENCOUNTER — Other Ambulatory Visit: Payer: Self-pay | Admitting: Obstetrics and Gynecology

## 2020-11-27 DIAGNOSIS — Z1231 Encounter for screening mammogram for malignant neoplasm of breast: Secondary | ICD-10-CM

## 2021-03-21 ENCOUNTER — Ambulatory Visit
Admission: RE | Admit: 2021-03-21 | Discharge: 2021-03-21 | Disposition: A | Discharge status: Home / Self Care | Payer: BC Managed Care – PPO | Source: Ambulatory Visit | Attending: Obstetrics and Gynecology | Admitting: Obstetrics and Gynecology

## 2021-03-21 ENCOUNTER — Other Ambulatory Visit: Payer: Self-pay

## 2021-03-21 DIAGNOSIS — Z1231 Encounter for screening mammogram for malignant neoplasm of breast: Secondary | ICD-10-CM | POA: Insufficient documentation

## 2022-02-03 ENCOUNTER — Other Ambulatory Visit: Payer: Self-pay | Admitting: Obstetrics and Gynecology

## 2022-02-03 ENCOUNTER — Other Ambulatory Visit: Payer: Self-pay | Admitting: Family Medicine

## 2022-02-03 DIAGNOSIS — Z1231 Encounter for screening mammogram for malignant neoplasm of breast: Secondary | ICD-10-CM

## 2022-03-24 ENCOUNTER — Ambulatory Visit
Admission: RE | Admit: 2022-03-24 | Discharge: 2022-03-24 | Disposition: A | Discharge status: Home / Self Care | Payer: BC Managed Care – PPO | Source: Ambulatory Visit | Attending: Family Medicine | Admitting: Family Medicine

## 2022-03-24 DIAGNOSIS — Z1231 Encounter for screening mammogram for malignant neoplasm of breast: Secondary | ICD-10-CM

## 2022-04-09 ENCOUNTER — Encounter: Payer: BC Managed Care – PPO | Attending: Family Medicine | Admitting: Dietician

## 2022-04-09 ENCOUNTER — Encounter: Payer: Self-pay | Admitting: Dietician

## 2022-04-09 VITALS — Ht 66.0 in | Wt 205.3 lb

## 2022-04-09 DIAGNOSIS — E669 Obesity, unspecified: Secondary | ICD-10-CM | POA: Diagnosis not present

## 2022-04-09 DIAGNOSIS — R7303 Prediabetes: Secondary | ICD-10-CM

## 2022-04-09 DIAGNOSIS — Z6833 Body mass index (BMI) 33.0-33.9, adult: Secondary | ICD-10-CM | POA: Diagnosis not present

## 2022-04-09 NOTE — Patient Instructions (Signed)
Include at least a small serving of some healthy carb choices such as a whole grain, beans, fruit with each meal. Ideally aim for 45grams each meal, but work up to that level gradually. ?Consider following Mediterranean eating pattern for overall health.  ?Keep up healthy choices and regular exercise for health benefit, even if scale does not show significant results.  ?Stress hormones, sleep can also affect weight/ ability to lose weight.  ?

## 2022-04-09 NOTE — Progress Notes (Signed)
Medical Nutrition Therapy: Visit start time: 1600  end time: 1700  ?Assessment:  Diagnosis: pre-diabetes, obesity ?Past medical history: borderline HTN ?Psychosocial issues/ stress concerns: none ? ?Preferred learning method:  ?Auditory ?Visual ? ? ?Current weight: 205.3lbs Height: 5'6"  BMI: 33.14 ? ?Medications, supplements: reconciled list in medical record ? ?Progress and evaluation:  ?Patient reports lifelong struggle with weight. Did not get taught healthy lifestyle habits in childhood.  ?Patient reports highest adult weight has been 275, started exercise and healthier food choices and potions, lost to 217 then became pregnant.  ?Resumed gym exercise, kcal control, healthy choices ?Had hysterectomy in 2017 and weight loss has been harder since then.  ?Has been doing intermittent fasting for past 3 years with very slow weight loss.   ?Has portion-plate and keeps veg as 1/2 the plate, carb 1/4 or less, protein 1/4 or more ?Recent HbA1C of 5.7% (01/14/22) ? ?Physical activity: cardio and resistance training 30-45 minutes, 30-45 minutes, tries to keep daily steps up daily ? ?Dietary Intake:  ?Usual eating pattern includes 3 meals and 0 snacks per day. ?Dining out frequency: 1-2 meals per week. ? ?Breakfast: 10am at work -- protein shake with orgain protien powder with unsweetened almond milk, fruit, spinach, occ peanut butter powder; eggs and English muffin; yogurt and fruit on weekends ?Snack: none ?Lunch: salad with rotisserie chicken + veggies, feta cheese, vinaigrette + fruit ie apple ?Snack: none ?Supper: tacos with ground chicken omits shells; usually lean protein + veg + some healthy carbs/ low carb versions sweet potato, quinoa feels bloated when eating rice ?Snack: none ?Beverages: water, rarely tea ? ?Intervention:  ? ?Nutrition Care Education: ?  ?Basic nutrition: basic food groups; appropriate nutrient balance; appropriate meal and snack schedule; general nutrition guidelines    ?Weight control:  determining reasonable weight loss rate; importance of low sugar and low fat choices; appropriate food portions; estimated energy needs for weight loss at 1400 kcal (considering current intake and dieting history), provided guidance for 45% CHO, 25% protein, 30% fat; discussed non-food factors that can affect weight is stress/ stress hormones, inadequate sleep; discussed importance of adequate caloric intake and balance of food groups to maximize exercise benefits and promote healthy metabolism ?Diabetes prevention:  appropriate meal and snack schedule, appropriate carb intake and balance, healthy carb choices, role of fiber, protein, fat ? ?Additional Intervention: ?Performed body composition analysis with InBody scale; skeletal muscle mass: 69.7lbs, body fat%: 38.3, BMR 1602 kcal ?Patient will shift nutritional balance in diet to include some additional healthy carb foods, and will consider Mediterranean eating pattern.  ?No MNT follow up scheduled at this time; patient will schedule later as needed. ? ? ?Nutritional Diagnosis:  [place]-2.2 Altered nutrition-related laboratory As related to pre-diabetes.  As evidenced by history of elevated HbA1C. ?[place]-3.3 Overweight/obesity As related to history of overweight, frequent dieting, possibly stress hormones.  As evidenced by current BMI of 33.14. ? ? ?Education Materials given:  ?Plate Planner with food lists, sample meal pattern ?Get Healthy with Mediterranean Style Eating ?Visit summary with goals/ instructions ? ? ?Learner/ who was taught:  ?Patient  ? ?Level of understanding: ?Verbalizes/ demonstrates competency ? ? ?Demonstrated degree of understanding via:   Teach back ?Learning barriers: ?None ? ? ?Willingness to learn/ readiness for change: ?Eager, change in progress ? ? ?Monitoring and Evaluation:  Dietary intake, exercise, and body weight ?     follow up: prn  ?

## 2022-12-09 ENCOUNTER — Other Ambulatory Visit: Payer: Self-pay | Admitting: Otolaryngology

## 2022-12-09 DIAGNOSIS — H9011 Conductive hearing loss, unilateral, right ear, with unrestricted hearing on the contralateral side: Secondary | ICD-10-CM

## 2022-12-17 ENCOUNTER — Ambulatory Visit
Admission: RE | Admit: 2022-12-17 | Discharge: 2022-12-17 | Disposition: A | Discharge status: Home / Self Care | Payer: BC Managed Care – PPO | Source: Ambulatory Visit | Attending: Otolaryngology | Admitting: Otolaryngology

## 2022-12-17 ENCOUNTER — Other Ambulatory Visit: Payer: BC Managed Care – PPO

## 2022-12-17 DIAGNOSIS — H9011 Conductive hearing loss, unilateral, right ear, with unrestricted hearing on the contralateral side: Secondary | ICD-10-CM

## 2023-01-06 ENCOUNTER — Other Ambulatory Visit: Payer: BC Managed Care – PPO

## 2023-02-16 ENCOUNTER — Other Ambulatory Visit: Payer: Self-pay | Admitting: Family Medicine

## 2023-02-16 DIAGNOSIS — Z1231 Encounter for screening mammogram for malignant neoplasm of breast: Secondary | ICD-10-CM

## 2023-03-26 ENCOUNTER — Ambulatory Visit
Admission: RE | Admit: 2023-03-26 | Discharge: 2023-03-26 | Disposition: A | Discharge status: Home / Self Care | Payer: BC Managed Care – PPO | Source: Ambulatory Visit | Attending: Family Medicine | Admitting: Family Medicine

## 2023-03-26 DIAGNOSIS — Z1231 Encounter for screening mammogram for malignant neoplasm of breast: Secondary | ICD-10-CM

## 2024-08-12 ENCOUNTER — Other Ambulatory Visit: Payer: Self-pay | Admitting: Family Medicine

## 2024-08-12 DIAGNOSIS — Z1231 Encounter for screening mammogram for malignant neoplasm of breast: Secondary | ICD-10-CM

## 2024-09-01 ENCOUNTER — Ambulatory Visit
Admission: RE | Admit: 2024-09-01 | Discharge: 2024-09-01 | Disposition: A | Discharge status: Home / Self Care | Payer: Self-pay | Source: Ambulatory Visit | Attending: Family Medicine | Admitting: Family Medicine

## 2024-09-01 DIAGNOSIS — Z1231 Encounter for screening mammogram for malignant neoplasm of breast: Secondary | ICD-10-CM | POA: Insufficient documentation
